# Patient Record
Sex: Female | Born: 2000 | Hispanic: No | Smoking: Never smoker
Health system: Southern US, Community
[De-identification: ages and names within clinical notes are randomized; demographics above are authoritative.]

---

## 2020-07-27 ENCOUNTER — Encounter (HOSPITAL_COMMUNITY): Payer: Self-pay

## 2020-07-27 ENCOUNTER — Other Ambulatory Visit: Payer: Self-pay

## 2020-07-27 ENCOUNTER — Emergency Department (HOSPITAL_COMMUNITY)
Admission: EM | Admit: 2020-07-27 | Discharge: 2020-07-27 | Disposition: A | Discharge status: Home / Self Care | Attending: Emergency Medicine | Admitting: Emergency Medicine

## 2020-07-27 DIAGNOSIS — F419 Anxiety disorder, unspecified: Secondary | ICD-10-CM | POA: Diagnosis present

## 2020-07-27 MED ORDER — SERTRALINE HCL 100 MG PO TABS: 100.0000 mg | ORAL_TABLET | Freq: Every day | ORAL | 1 refills | Status: AC

## 2020-07-27 MED ORDER — SERTRALINE HCL 50 MG PO TABS: 50.0000 mg | ORAL_TABLET | Freq: Every day | ORAL | 1 refills | Status: DC

## 2020-07-27 MED ORDER — SERTRALINE HCL 50 MG PO TABS: 50.0000 mg | ORAL_TABLET | Freq: Every day | ORAL | Status: DC

## 2020-07-27 NOTE — ED Provider Notes (Signed)
[facility]-EMERGENCY DEPT Provider Note   CSN: [id number] Arrival date & time: 07/27/20  2119     History Chief Complaint  Patient presents with  . Anxiety    Vicki Williams is a 19 y.o. female.  HPI   Patient presented to the ED for evaluation of anxiety.  Patient states she has had anxiety for a long time.  Patient states she has a hard time talking without crying.  Certain situations make her more anxious.  She has been managed on Zoloft.  Patient recently moved with her boyfriend to this area.  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  Patient states she has had increased anxiety recently.  She has been pulling at her hair.  Patient denies any suicidal ideation.  She has no intent to harm herself.  Patient states she pulls her hair to relieve her stress.  Patient denies any alcohol or drug use.  History reviewed. No pertinent past medical history.  There are no problems to display for this patient.   History reviewed. No pertinent surgical history.   OB History   No obstetric history on file.     History reviewed. No pertinent family history.  Social History   Tobacco Use  . Smoking status: Not on file  Substance Use Topics  . Alcohol use: Not on file  . Drug use: Not on file    Home Medications Prior to Admission medications   Medication Sig Start Date End Date Taking? Authorizing Provider  sertraline (ZOLOFT) 50 MG tablet Take 1 tablet (50 mg total) by mouth daily. 07/27/20   Linwood Dibbles, MD    Allergies    Patient has no known allergies.  Review of Systems   Review of Systems  All other systems reviewed and are negative.   Physical Exam Updated Vital Signs BP (!) 146/87 (BP Location: Left Arm)   Pulse 84   Temp 98.9 F (37.2 C) (Oral)   Resp 17   Ht 1.702 m (5\' 7" )   Wt 56.7 kg   SpO2 99%   BMI 19.58 kg/m   Physical Exam Vitals and nursing note reviewed.  Constitutional:      General: She is not in acute  distress.    Appearance: She is well-developed.  HENT:     Head: Normocephalic and atraumatic.     Right Ear: External ear normal.     Left Ear: External ear normal.  Eyes:     General: No scleral icterus.       Right eye: No discharge.        Left eye: No discharge.     Conjunctiva/sclera: Conjunctivae normal.  Neck:     Trachea: No tracheal deviation.  Cardiovascular:     Rate and Rhythm: Normal rate and regular rhythm.  Pulmonary:     Effort: Pulmonary effort is normal. No respiratory distress.     Breath sounds: Normal breath sounds. No stridor. No wheezing or rales.  Abdominal:     General: Bowel sounds are normal. There is no distension.     Palpations: Abdomen is soft.     Tenderness: There is no abdominal tenderness. There is no guarding or rebound.  Musculoskeletal:        General: No tenderness.     Cervical back: Neck supple.  Skin:    General: Skin is warm and dry.     Findings: No rash.  Neurological:     Mental Status: She is alert.  Cranial Nerves: No cranial nerve deficit (no facial droop, extraocular movements intact, no slurred speech).     Sensory: No sensory deficit.     Motor: No abnormal muscle tone or seizure activity.     Coordination: Coordination normal.  Psychiatric:        Mood and Affect: Mood is anxious. Affect is tearful.        Speech: Speech normal.        Behavior: Behavior is withdrawn. Behavior is not aggressive or hyperactive. Behavior is cooperative.        Thought Content: Thought content does not include homicidal or suicidal ideation. Thought content does not include homicidal or suicidal plan.     ED Results / Procedures / Treatments   Labs (all labs ordered are listed, but only abnormal results are displayed) Labs Reviewed - No data to display  EKG None  Radiology No results found.  Procedures Procedures (including critical care time)  Medications Ordered in ED Medications  sertraline (ZOLOFT) tablet 50 mg (has no  administration in time range)    ED Course  I have reviewed the triage vital signs and the nursing notes.  Pertinent labs & imaging results that were available during my care of the patient were reviewed by me and considered in my medical decision making (see chart for details).    MDM Rules/Calculators/A&P                          Patient presents with worsening anxiety.  Patient contracts for safety.  She has no intent to harm herself.  Patient states she is stressed about being here in the emergency room.  She has been taking 50 mg of Zoloft before and would like to increase her dose.  I think this is reasonable.  I will also give her a referral to the [facility] behavioral health center.  Explained to her that she should contact them to make an appointment for therapy and further assistance.  Patient understands she can return to the ED at any time. Final Clinical Impression(s) / ED Diagnoses Final diagnoses:  Anxiety    Rx / DC Orders ED Discharge Orders         Ordered    sertraline (ZOLOFT) 50 MG tablet  Daily        07/27/20 2236           Linwood Dibbles, MD 07/27/20 2241

## 2020-07-27 NOTE — ED Triage Notes (Signed)
Pt reports that she called her [employer] nurse hotline to see if they could increase her Zoloft d/t increased anxiety. They told her that she needed to go to the ED because she has been pulling out her hair and hitting herself. She denies SI or a plan to kill herself.

## 2020-07-27 NOTE — Discharge Instructions (Addendum)
Start taking the Zoloft at 100 mg/day.  Contact the [facility] behavioral health center for further assistance in treatment.  Return as needed to the ED for worsening symptoms

## 2020-10-20 ENCOUNTER — Ambulatory Visit: Attending: Internal Medicine

## 2020-10-20 DIAGNOSIS — Z23 Encounter for immunization: Secondary | ICD-10-CM

## 2020-10-20 NOTE — Progress Notes (Signed)
   Covid-19 Vaccination Clinic  Name:  Vicki Williams    MRN: [id number] DOB: August 02, 2001  10/20/2020  [name] was observed post Covid-19 immunization for 15 minutes without incident. She was provided with Vaccine Information Sheet and instruction to access the V-Safe system.   [name] was instructed to call 911 with any severe reactions post vaccine: Marland Kitchen Difficulty breathing  . Swelling of face and throat  . A fast heartbeat  . A bad rash all over body  . Dizziness and weakness   Immunizations Administered    Name Date Dose VIS Date Route   Pfizer COVID-19 Vaccine 10/20/2020 12:09 PM 0.3 mL 09/05/2020 Intramuscular   Manufacturer: [employer], [employer]   Lot: [id number]   NDC: [address]-8

## 2020-11-23 ENCOUNTER — Other Ambulatory Visit

## 2020-11-23 ENCOUNTER — Other Ambulatory Visit: Payer: Self-pay

## 2020-11-23 DIAGNOSIS — Z20822 Contact with and (suspected) exposure to covid-19: Secondary | ICD-10-CM

## 2020-11-27 ENCOUNTER — Emergency Department (HOSPITAL_COMMUNITY)
Admission: EM | Admit: 2020-11-27 | Discharge: 2020-11-27 | Disposition: A | Discharge status: Home / Self Care | Attending: Emergency Medicine | Admitting: Emergency Medicine

## 2020-11-27 ENCOUNTER — Encounter (HOSPITAL_COMMUNITY): Payer: Self-pay

## 2020-11-27 ENCOUNTER — Emergency Department (HOSPITAL_COMMUNITY)

## 2020-11-27 DIAGNOSIS — S60415A Abrasion of left ring finger, initial encounter: Secondary | ICD-10-CM | POA: Insufficient documentation

## 2020-11-27 DIAGNOSIS — W540XXA Bitten by dog, initial encounter: Secondary | ICD-10-CM | POA: Diagnosis not present

## 2020-11-27 DIAGNOSIS — S61253A Open bite of left middle finger without damage to nail, initial encounter: Secondary | ICD-10-CM | POA: Insufficient documentation

## 2020-11-27 DIAGNOSIS — Z23 Encounter for immunization: Secondary | ICD-10-CM | POA: Insufficient documentation

## 2020-11-27 DIAGNOSIS — Y93K9 Activity, other involving animal care: Secondary | ICD-10-CM | POA: Diagnosis not present

## 2020-11-27 LAB — NOVEL CORONAVIRUS, NAA: SARS-CoV-2, NAA: NOT DETECTED

## 2020-11-27 MED ORDER — TETANUS-DIPHTH-ACELL PERTUSSIS 5-2.5-18.5 LF-MCG/0.5 IM SUSY
0.5000 mL | PREFILLED_SYRINGE | Freq: Once | INTRAMUSCULAR | Status: AC
  Administered 2020-11-27: 0.5 mL via INTRAMUSCULAR
  Filled 2020-11-27: qty 0.5

## 2020-11-27 MED ORDER — AMOXICILLIN-POT CLAVULANATE 875-125 MG PO TABS: 1.0000 | ORAL_TABLET | Freq: Two times a day (BID) | ORAL | 0 refills | Status: AC

## 2020-11-27 MED ORDER — IBUPROFEN 800 MG PO TABS
800.0000 mg | ORAL_TABLET | Freq: Once | ORAL | Status: AC
  Administered 2020-11-27: 800 mg via ORAL
  Filled 2020-11-27: qty 1

## 2020-11-27 NOTE — Discharge Instructions (Addendum)
Keep the wounds clean and dry.  Wear the finger splint for approximately 2 weeks.  You may take Tylenol or ibuprofen for pain.  Please follow-up with hand surgery.

## 2020-11-27 NOTE — ED Provider Notes (Signed)
[facility]-EMERGENCY DEPT Provider Note   CSN: [id number] Arrival date & time: 11/27/20  1205     History Chief Complaint  Patient presents with  . Animal Bite    Vicki Williams is a 20 y.o. female.  HPI 20 year old female with no significant medical history presents to the ER with a dog bite to her left middle finger.  Patient states that her dog was trying to eat a hair tie and she stuck her hand in his mouth to take it out.  She states the dog bit down.  Has some abrasions to her DIP.  Unknown tetanus status.  Denies any numbness or tingling.  Has some pain with flexion of the DIP.  Dog is vaccinated for rabies.    History reviewed. No pertinent past medical history.  There are no problems to display for this patient.   History reviewed. No pertinent surgical history.   OB History   No obstetric history on file.     History reviewed. No pertinent family history.     Home Medications Prior to Admission medications   Medication Sig Start Date End Date Taking? Authorizing Provider  amoxicillin-clavulanate (AUGMENTIN) 875-125 MG tablet Take 1 tablet by mouth every 12 (twelve) hours for 7 days. 11/27/20 12/04/20 Yes Mare Ferrari, PA-C  sertraline (ZOLOFT) 100 MG tablet Take 1 tablet (100 mg total) by mouth daily. 07/27/20   Linwood Dibbles, MD    Allergies    Patient has no known allergies.  Review of Systems   Review of Systems  Musculoskeletal: Positive for arthralgias.  Skin: Positive for color change and wound.  Neurological: Negative for weakness and numbness.    Physical Exam Updated Vital Signs BP 105/73 (BP Location: Right Arm)   Pulse 85   Temp 98.9 F (37.2 C) (Oral)   Resp 18   LMP 11/13/2020 (Approximate)   SpO2 99%   Physical Exam Vitals and nursing note reviewed.  Constitutional:      General: She is not in acute distress.    Appearance: She is well-developed and well-nourished.  HENT:     Head: Normocephalic and  atraumatic.  Eyes:     Conjunctiva/sclera: Conjunctivae normal.  Cardiovascular:     Rate and Rhythm: Normal rate and regular rhythm.     Heart sounds: No murmur heard.   Pulmonary:     Effort: Pulmonary effort is normal. No respiratory distress.     Breath sounds: Normal breath sounds.  Abdominal:     Palpations: Abdomen is soft.     Tenderness: There is no abdominal tenderness.  Musculoskeletal:        General: Tenderness and signs of injury present. No edema.     Cervical back: Neck supple.     Comments: Left DIP with superficial abrasions to the lateral and medial side.  No deep wounds.  Full flexion and extension of the DIP passively.  Mild nailbed involvement dorsally with evidence of superficial skin abrasion. Left ring finger and pointer finger with superficial abrasions  Skin:    General: Skin is warm and dry.     Findings: Erythema and lesion present.  Neurological:     General: No focal deficit present.     Mental Status: She is alert and oriented to person, place, and time.  Psychiatric:        Mood and Affect: Mood and affect and mood normal.        Behavior: Behavior normal.  ED Results / Procedures / Treatments   Labs (all labs ordered are listed, but only abnormal results are displayed) Labs Reviewed - No data to display  EKG None  Radiology DG Hand Complete Left  Result Date: 11/27/2020 CLINICAL DATA:  Dog bite to left hand middle and ring finger lacerations. EXAM: LEFT HAND - COMPLETE 3+ VIEW COMPARISON:  None. FINDINGS: No fracture or dislocation. Mild swelling of the third and fourth digits. There is linear calcific density adjacent to the anterior cortex of the distal phalanx of the third digit which may be due to an avulsion type injury. IMPRESSION: Findings suspicious for avulsion type fracture with the anterior cortex of the distal phalanx of the third digit. Otherwise no fracture, dislocation, or soft tissue abnormalities. Electronically  Signed   By: Acquanetta Belling M.D.   On: 11/27/2020 13:49    Procedures Procedures (including critical care time)  Medications Ordered in ED Medications  Tdap (BOOSTRIX) injection 0.5 mL (0.5 mLs Intramuscular Given 11/27/20 1311)  ibuprofen (ADVIL) tablet 800 mg (800 mg Oral Given 11/27/20 1312)    ED Course  I have reviewed the triage vital signs and the nursing notes.  Pertinent labs & imaging results that were available during my care of the patient were reviewed by me and considered in my medical decision making (see chart for details).    MDM Rules/Calculators/A&P                          20 year old female with complaints of dog bite to the middle finger.  Tetanus updated here.  Plain films with suspicious findings for avulsion type fracture with anterior cortex of the distal phalanx of the third digit.  Discussed these findings with Earney Hamburg, PA-C with hand surgery, he states that these abrasions appear superficial and concern for open fracture is low.  Nursing staff cleaned and irrigated the wound thoroughly, placed Band-Aids over the abrasions.  Will place in finger splint and start on Augmentin.  Dog is up-to-date on rabies vaccine.  Patient was given strict instructions to complete antibiotic course.  We discussed return precautions including worsening pain, swelling, fevers, chills etc.  Will refer to hand surgery.  Encouraged Tylenol or ibuprofen for pain.  All the patient's questions have been answered to her satisfaction, she voiced understanding and is agreeable.  At this stage in the ED course, the patient has been screened and stable for discharge.  Final Clinical Impression(s) / ED Diagnoses Final diagnoses:  Dog bite, initial encounter    Rx / DC Orders ED Discharge Orders         Ordered    amoxicillin-clavulanate (AUGMENTIN) 875-125 MG tablet  Every 12 hours        11/27/20 1350           Leone Brand 11/27/20 1414    Maia Plan,  MD 11/29/20 1020

## 2020-11-27 NOTE — Progress Notes (Signed)
Orthopedic Tech Progress Note Patient Details:  Vicki Williams 09-Jun-2001 [id number]  Ortho Devices Type of Ortho Device: Finger splint Ortho Device/Splint Location: LUE Ortho Device/Splint Interventions: Ordered,Application   Post Interventions Patient Tolerated: Well Instructions Provided: Care of device   [name] 11/27/2020, 2:27 PM

## 2020-11-27 NOTE — ED Triage Notes (Signed)
Pt presents with c/o dog bite to her left middle finger. Pt reports the dog was hers and the dogs vaccinations are up to date. Bleeding to the wound is controlled.

## 2021-04-26 ENCOUNTER — Other Ambulatory Visit: Payer: Self-pay

## 2021-04-26 ENCOUNTER — Encounter (HOSPITAL_COMMUNITY): Payer: Self-pay | Admitting: Emergency Medicine

## 2021-04-26 ENCOUNTER — Emergency Department (HOSPITAL_COMMUNITY)
Admission: EM | Admit: 2021-04-26 | Discharge: 2021-04-26 | Disposition: A | Discharge status: Home / Self Care | Attending: Emergency Medicine | Admitting: Emergency Medicine

## 2021-04-26 DIAGNOSIS — N3001 Acute cystitis with hematuria: Secondary | ICD-10-CM | POA: Diagnosis not present

## 2021-04-26 DIAGNOSIS — R300 Dysuria: Secondary | ICD-10-CM | POA: Diagnosis present

## 2021-04-26 LAB — URINALYSIS, ROUTINE W REFLEX MICROSCOPIC
Bilirubin Urine: NEGATIVE
Glucose, UA: NEGATIVE mg/dL
Ketones, ur: NEGATIVE mg/dL
Nitrite: NEGATIVE
Protein, ur: 30 mg/dL — AB
RBC / HPF: >50 RBC/hpf — ABNORMAL HIGH (ref 0–5)
Specific Gravity, Urine: 1.025 (ref 1.005–1.030)
WBC, UA: >50 WBC/hpf — ABNORMAL HIGH (ref 0–5)
pH: 6 (ref 5.0–8.0)

## 2021-04-26 LAB — PREGNANCY, URINE: Preg Test, Ur: NEGATIVE

## 2021-04-26 MED ORDER — NITROFURANTOIN MONOHYD MACRO 100 MG PO CAPS: 100.0000 mg | ORAL_CAPSULE | Freq: Two times a day (BID) | ORAL | 0 refills | Status: AC

## 2021-04-26 MED ORDER — NITROFURANTOIN MONOHYD MACRO 100 MG PO CAPS
100.0000 mg | ORAL_CAPSULE | Freq: Once | ORAL | Status: AC
  Administered 2021-04-26: 23:00:00 100 mg via ORAL
  Filled 2021-04-26: qty 1

## 2021-04-26 NOTE — ED Triage Notes (Signed)
Patient presents with what she believes to be a UTI. She has had painful urination that has worsened over the past 3 days. Today she noticed blood with urination.

## 2021-04-26 NOTE — ED Provider Notes (Signed)
[facility]-EMERGENCY DEPT Provider Note   CSN: [id number] Arrival date & time: 04/26/21  1809     History Chief Complaint  Patient presents with   Dysuria    Vicki Williams is a 20 y.o. female without significant past medical hx who presents to the ED with complaints of dysuria x 2 -3 days. Patient reports dysuria, frequency, urgency, and suprapubic pressure. Today noted a small amount of blood in her urine. No other alleviating/aggravating factors. She is concerned she has a UTI, has not had one previously. Denies fever, vomiting, flank pain, vaginal discharge ,or concern for STI.   HPI     History reviewed. No pertinent past medical history.  There are no problems to display for this patient.   History reviewed. No pertinent surgical history.   OB History   No obstetric history on file.     History reviewed. No pertinent family history.  Social History   Tobacco Use   Smoking status: Never   Smokeless tobacco: Never    Home Medications Prior to Admission medications   Medication Sig Start Date End Date Taking? Authorizing Provider  sertraline (ZOLOFT) 100 MG tablet Take 1 tablet (100 mg total) by mouth daily. 07/27/20   Linwood Dibbles, MD    Allergies    Patient has no known allergies.  Review of Systems   Review of Systems  Constitutional:  Negative for chills and fever.  Respiratory:  Negative for shortness of breath.   Cardiovascular:  Negative for chest pain.  Gastrointestinal:  Positive for abdominal pain (mild suprapubic pressure). Negative for vomiting.  Genitourinary:  Positive for dysuria, frequency, hematuria and urgency. Negative for flank pain and vaginal discharge.  Neurological:  Negative for syncope.  All other systems reviewed and are negative.  Physical Exam Updated Vital Signs BP 110/76 (BP Location: Left Arm)   Pulse 86   Temp 99.8 F (37.7 C) (Oral)   Resp 18   Ht 5\' 6"  (1.676 m)   Wt 61.2 kg   SpO2 97%   BMI  21.79 kg/m   Physical Exam Vitals and nursing note reviewed.  Constitutional:      General: She is not in acute distress.    Appearance: She is well-developed. She is not toxic-appearing.  HENT:     Head: Normocephalic and atraumatic.  Eyes:     General:        Right eye: No discharge.        Left eye: No discharge.     Conjunctiva/sclera: Conjunctivae normal.  Cardiovascular:     Rate and Rhythm: Normal rate and regular rhythm.  Pulmonary:     Effort: Pulmonary effort is normal. No respiratory distress.     Breath sounds: Normal breath sounds. No wheezing, rhonchi or rales.  Abdominal:     General: There is no distension.     Palpations: Abdomen is soft.     Tenderness: There is no abdominal tenderness. There is no right CVA tenderness, left CVA tenderness, guarding or rebound.  Musculoskeletal:     Cervical back: Neck supple.  Skin:    General: Skin is warm and dry.     Findings: No rash.  Neurological:     Mental Status: She is alert.     Comments: Clear speech.   Psychiatric:        Behavior: Behavior normal.    ED Results / Procedures / Treatments   Labs (all labs ordered are listed, but only abnormal results are displayed)  Labs Reviewed  URINE CULTURE  URINALYSIS, ROUTINE W REFLEX MICROSCOPIC  PREGNANCY, URINE    EKG None  Radiology No results found.  Procedures Procedures   Medications Ordered in ED Medications - No data to display  ED Course  I have reviewed the triage vital signs and the nursing notes.  Pertinent labs & imaging results that were available during my care of the patient were reviewed by me and considered in my medical decision making (see chart for details).    MDM Rules/Calculators/A&P                         Patient presents to the ED with complaints of dysuria. Nontoxic, initial mild tachycardia resoled.   Additional history obtained:  Additional history obtained from nursing note review.   Lab Tests:  I Ordered,  reviewed, and interpreted labs, which included:  Preg test: Negative UA: consistent with UTI.   ED Course:  Sxs and UA consistent w/ UTI. Patient without vaginal discharge or concern for STI. No fever, vomiting, or CVA tenderness to raise concern for pyelonephritis. Will tx with abx, culture was sent by triage.   I discussed results, treatment plan, need for follow-up, and return precautions with the patient. Provided opportunity for questions, patient confirmed understanding and is in agreement with plan.   Portions of this note were generated with Scientist, clinical (histocompatibility and immunogenetics). Dictation errors may occur despite best attempts at proofreading.  Final Clinical Impression(s) / ED Diagnoses Final diagnoses:  Acute cystitis with hematuria    Rx / DC Orders ED Discharge Orders          Ordered    nitrofurantoin, macrocrystal-monohydrate, (MACROBID) 100 MG capsule  2 times daily        04/26/21 2238             Cherly Anderson, PA-C 04/26/21 2240    Melene Plan, DO 04/26/21 2243

## 2021-04-26 NOTE — Discharge Instructions (Addendum)
You were seen in the ER for a UTI.  We are sending you home with macrobid, this is an antibiotic.   Please take all of your antibiotics until finished. You may develop abdominal discomfort or diarrhea from the antibiotic.  You may help offset this with probiotics which you can buy at the store (ask your pharmacist if unable to find) or get probiotics in the form of eating yogurt. Do not eat or take the probiotics until 2 hours after your antibiotic. If you are unable to tolerate these side effects follow-up with your primary care provider or return to the emergency department.   If you begin to experience any blistering, rashes, swelling, or difficulty breathing seek medical care for evaluation of potentially more serious side effects.   Please be aware that this medication may interact with other medications you are taking, please be sure to discuss your medication list with your pharmacist. If you are taking birth control the antibiotic will deactivate your birth control for 2 weeks.   You may take AZO per over the counter dosing over the next 24 hours to help with discomfort.   Please follow up with primary care within 1 week. Return to the ER for new or worsening symptoms including but not limited to fever, vomiting, inability to keep fluids down, flank/back pain especially to one side, new or worsening pain, or any other concerns.

## 2021-04-27 IMAGING — CR DG HAND COMPLETE 3+V*L*
3 series · 3 of 3 positions shown · non-contrast
Comparison: None.

CLINICAL DATA: Dog bite to left hand middle and ring finger
lacerations.

EXAM:
LEFT HAND - COMPLETE 3+ VIEW

[x finger pa left]
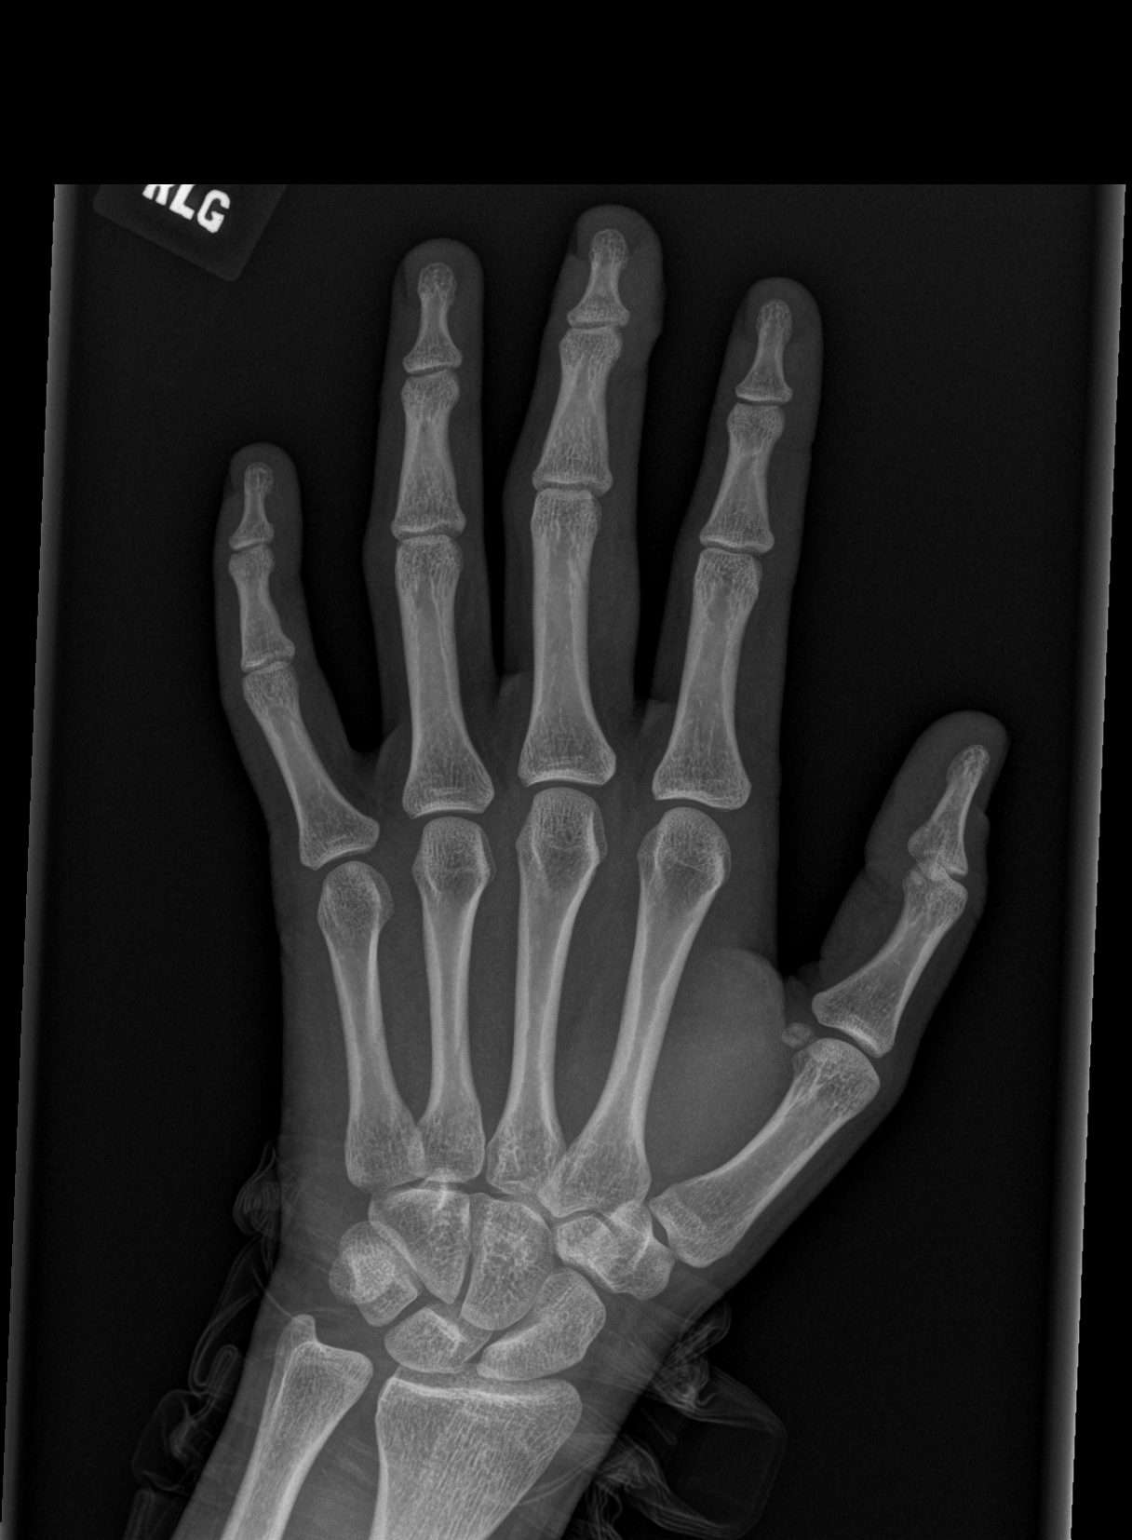

[x finger obl left]
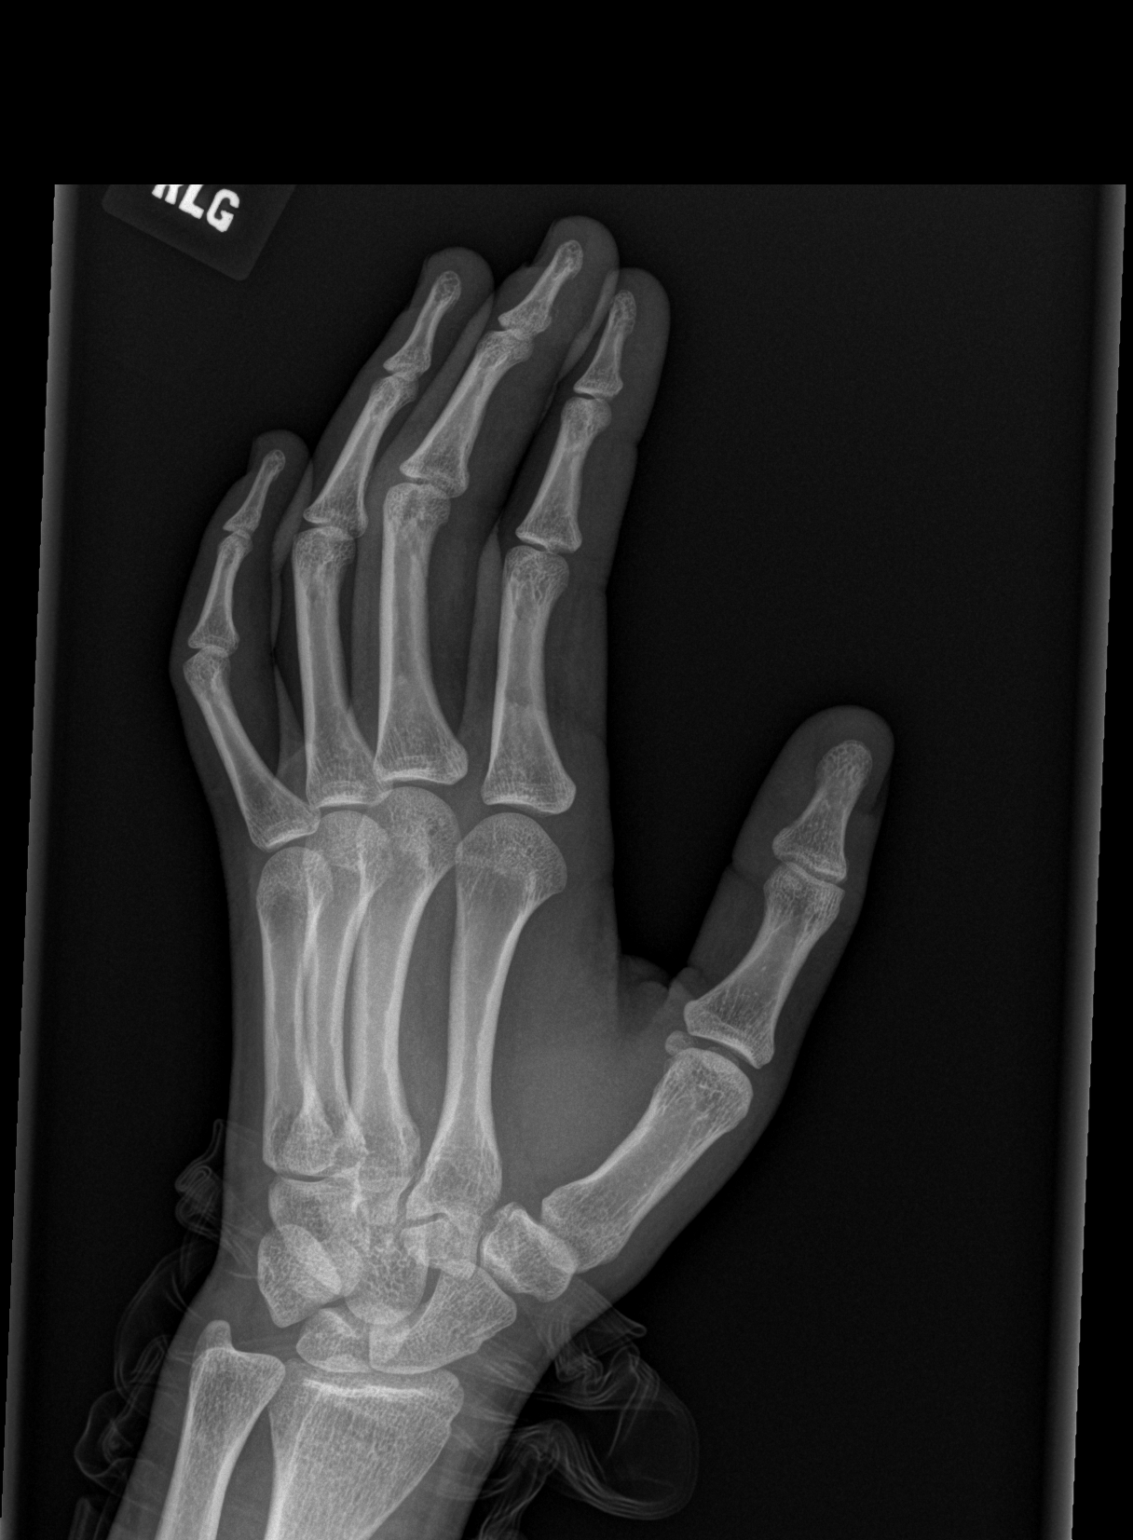

[x finger lat left]
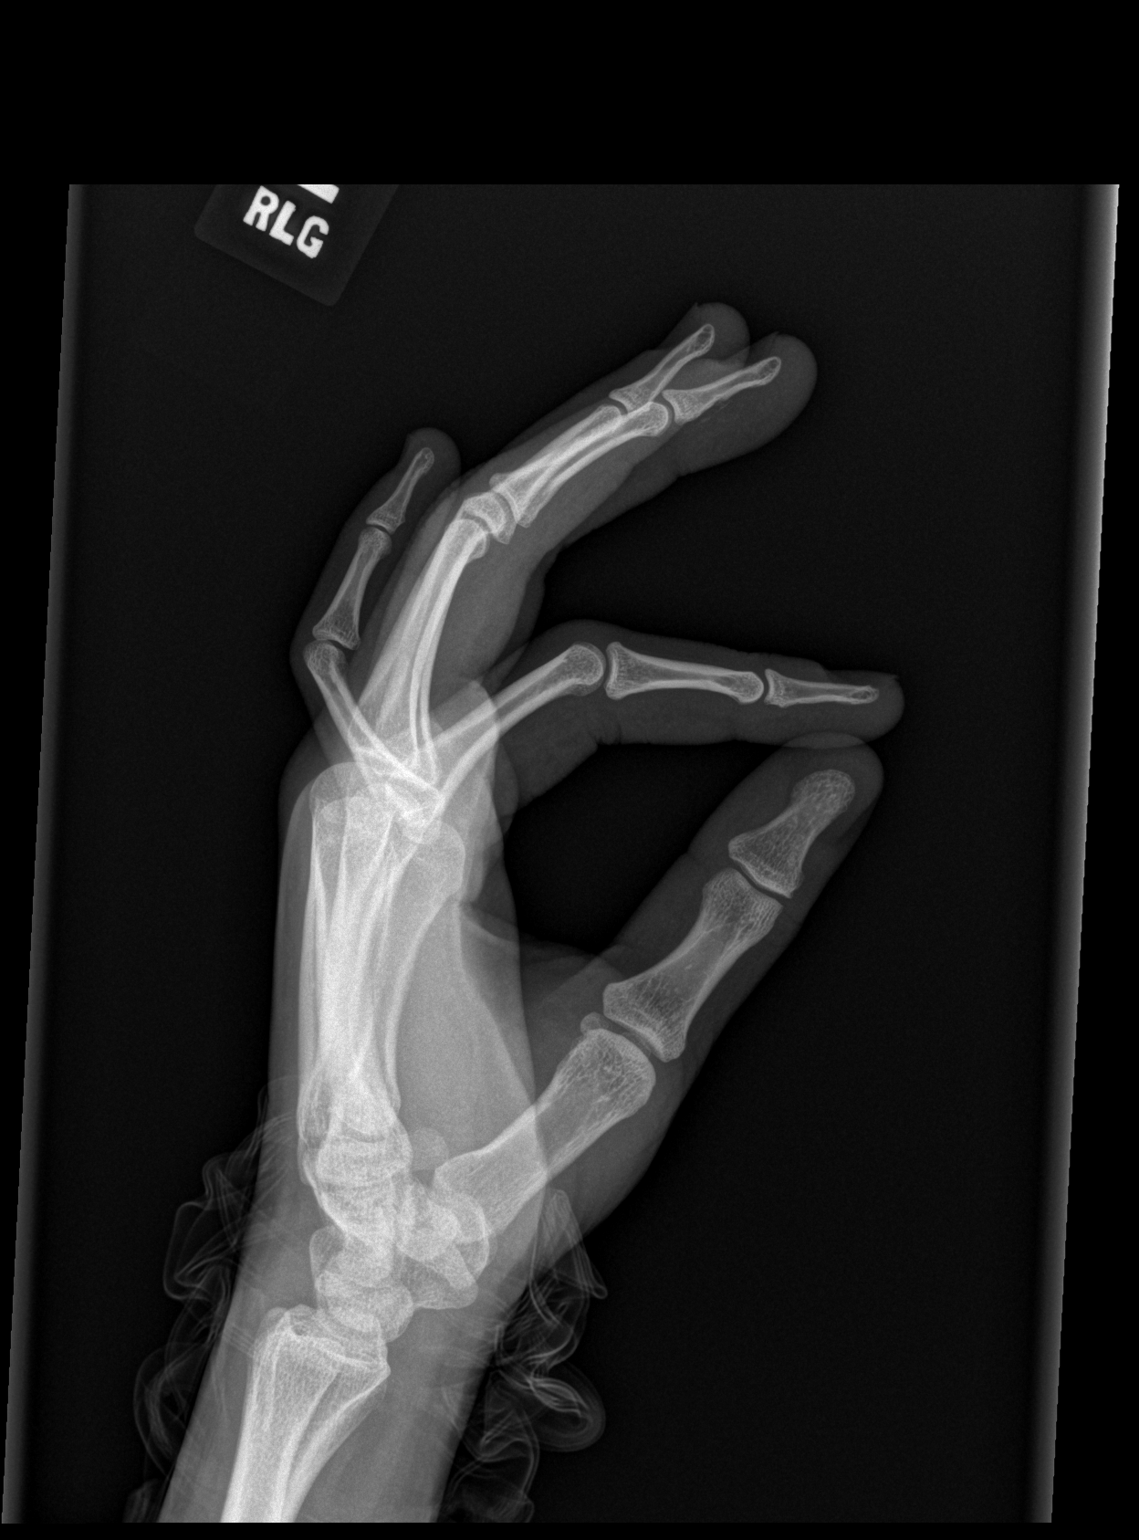

[3 of 3 positions shown; findings below may reference images not displayed]

FINDINGS: No fracture or dislocation. Mild swelling of the third and fourth
digits. There is linear calcific density adjacent to the anterior
cortex of the distal phalanx of the third digit which may be due to
an avulsion type injury.
IMPRESSION: Findings suspicious for avulsion type fracture with the anterior
cortex of the distal phalanx of the third digit. Otherwise no
fracture, dislocation, or soft tissue abnormalities.

## 2021-04-29 LAB — URINE CULTURE: Culture: 80000 — AB

## 2021-04-30 ENCOUNTER — Telehealth: Payer: Self-pay | Admitting: Emergency Medicine

## 2021-04-30 NOTE — Telephone Encounter (Signed)
Post ED Visit - Positive Culture Follow-up  Culture report reviewed by antimicrobial stewardship pharmacist: [facility] []  , Pharm.D. []  Enzo Bi, Pharm.D., BCPS AQ-ID []  , Pharm.D., BCPS []  Celedonio Miyamoto, .D., BCPS []  [place], .D., BCPS, AAHIVP []  Georgina Pillion, Pharm.D., BCPS, AAHIVP []  [address], PharmD, BCPS []  , PharmD, BCPS []  [place], PharmD, BCPS []  [address], PharmD []  , PharmD, BCPS []  Estella Husk, PharmD  Pharmacy Team []  Lysle Pearl, PharmD []  , PharmD []  Phillips Climes, PharmD []  , Rph []  Agapito Games) , PharmD []  Verlan Friends, PharmD []  , PharmD []  Mervyn Gay, PharmD []  , PharmD []  Vinnie Level, PharmD []  [facility], PharmD []  , PharmD []  Len Childs, PharmD   Positive urine culture Treated with nitrofurantoin, organism sensitive to the same and no further patient follow-up is required at this time.  04/30/2021, 10:25 AM

## 2021-08-06 ENCOUNTER — Ambulatory Visit: Admitting: Emergency Medicine
